# Patient Record
Sex: Female | Born: 1965 | Race: White | Hispanic: No
Health system: Southern US, Community
[De-identification: ages and names within clinical notes are randomized; demographics above are authoritative.]

---

## 2013-09-01 ENCOUNTER — Observation Stay: Payer: Self-pay | Admitting: Internal Medicine

## 2013-09-01 LAB — BASIC METABOLIC PANEL
Anion Gap: 4 — ABNORMAL LOW (ref 7–16)
BUN: 12 mg/dL (ref 7–18)
Chloride: 105 mmol/L (ref 98–107)
Creatinine: 0.64 mg/dL (ref 0.60–1.30)
Glucose: 85 mg/dL (ref 65–99)
Osmolality: 273 (ref 275–301)
Sodium: 137 mmol/L (ref 136–145)

## 2013-09-01 LAB — CBC
HCT: 39 % (ref 35.0–47.0)
MCH: 30.2 pg (ref 26.0–34.0)
MCV: 89 fL (ref 80–100)
Platelet: 203 10*3/uL (ref 150–440)
WBC: 7.8 10*3/uL (ref 3.6–11.0)

## 2013-09-01 LAB — TROPONIN I
Troponin-I: <0.02 ng/mL
Troponin-I: <0.02 ng/mL

## 2013-09-01 LAB — CK TOTAL AND CKMB (NOT AT ARMC): CK-MB: 2.3 ng/mL (ref 0.5–3.6)

## 2013-09-01 IMAGING — CR DG CHEST 2V
1 series · 2 of 2 positions shown · non-contrast
Comparison: None.

CLINICAL DATA: Chest pain.

EXAM:
CHEST  2 VIEW

[Series 1: w chest pa · 0.14mm/px · 2 of 2 slices shown]
[im 1/2]
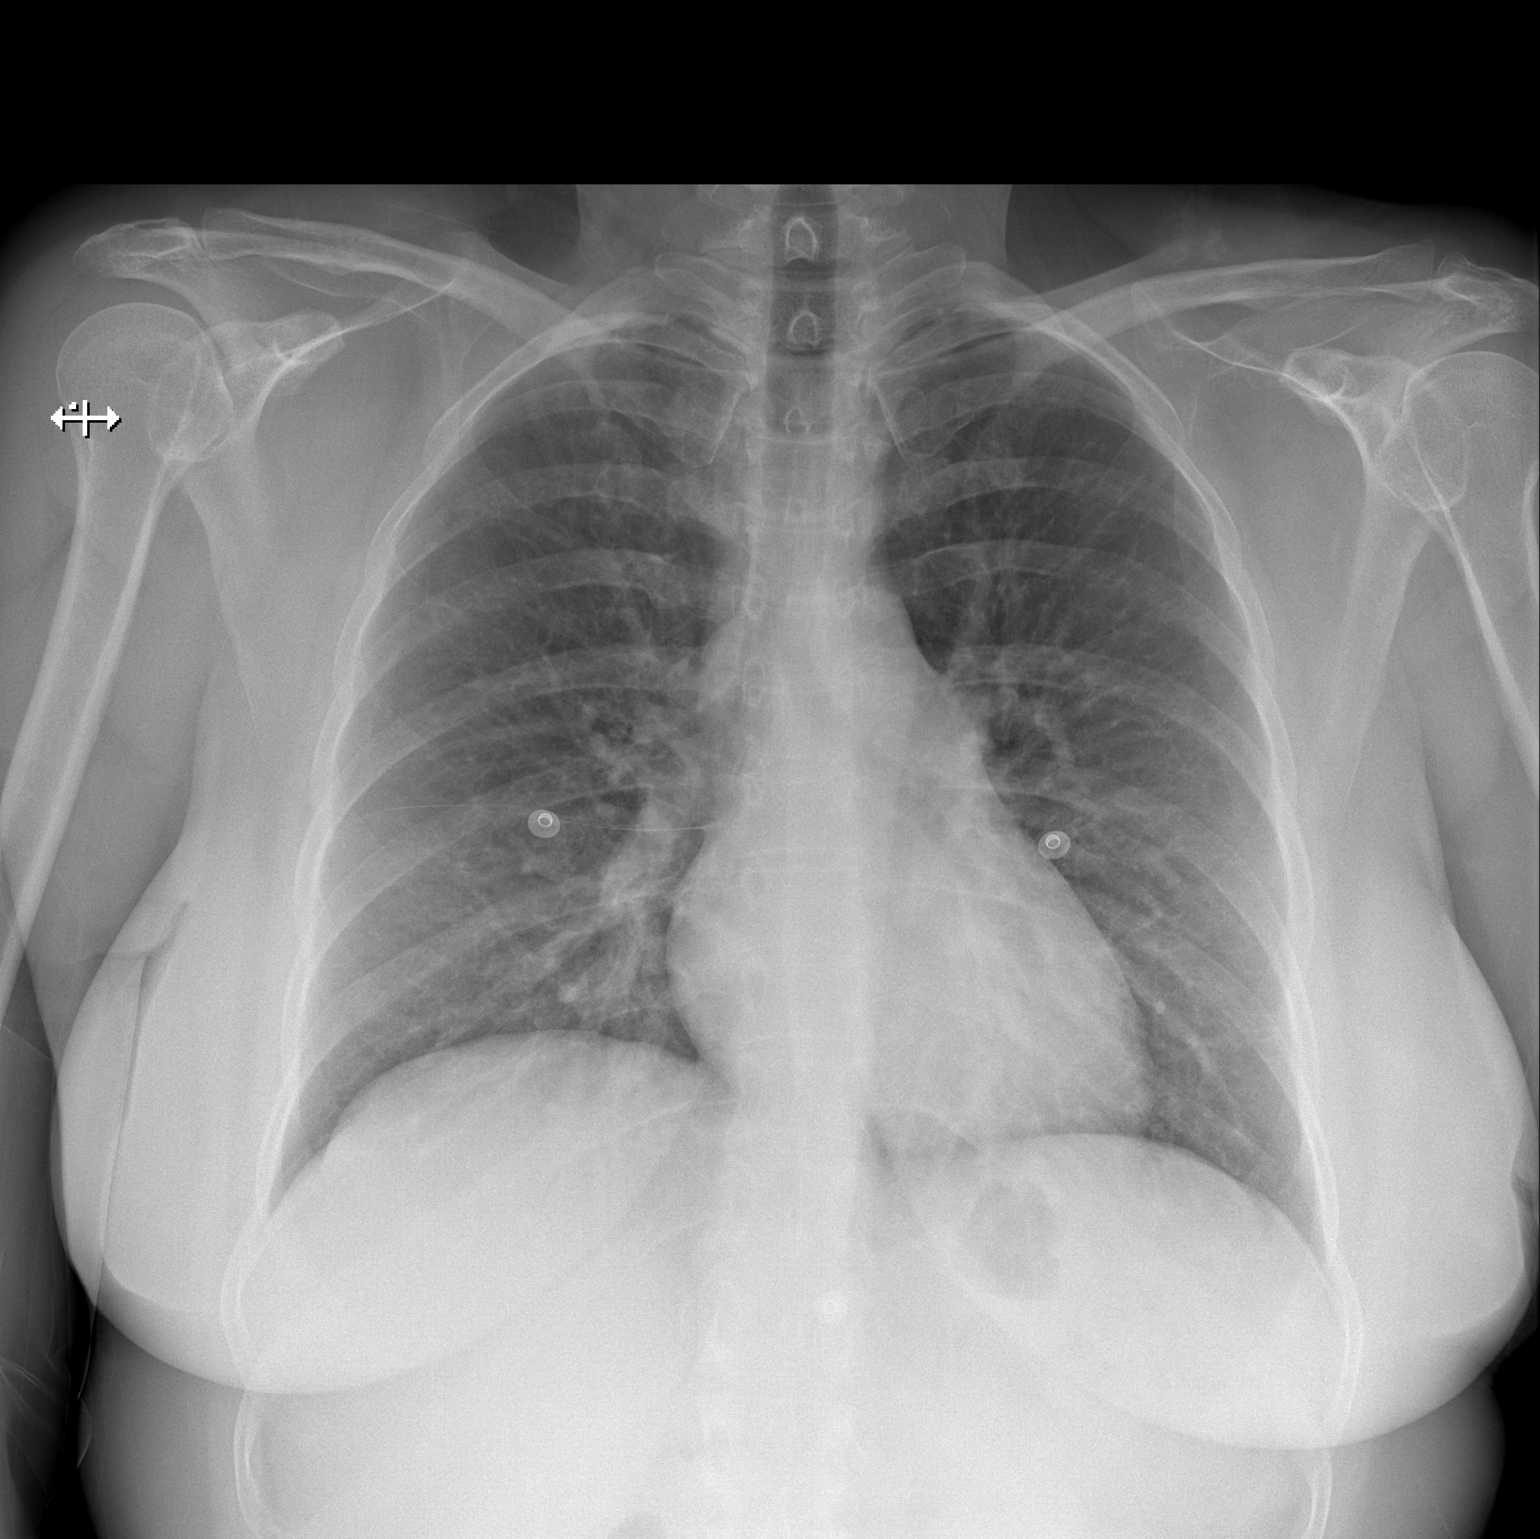
[im 2/2]
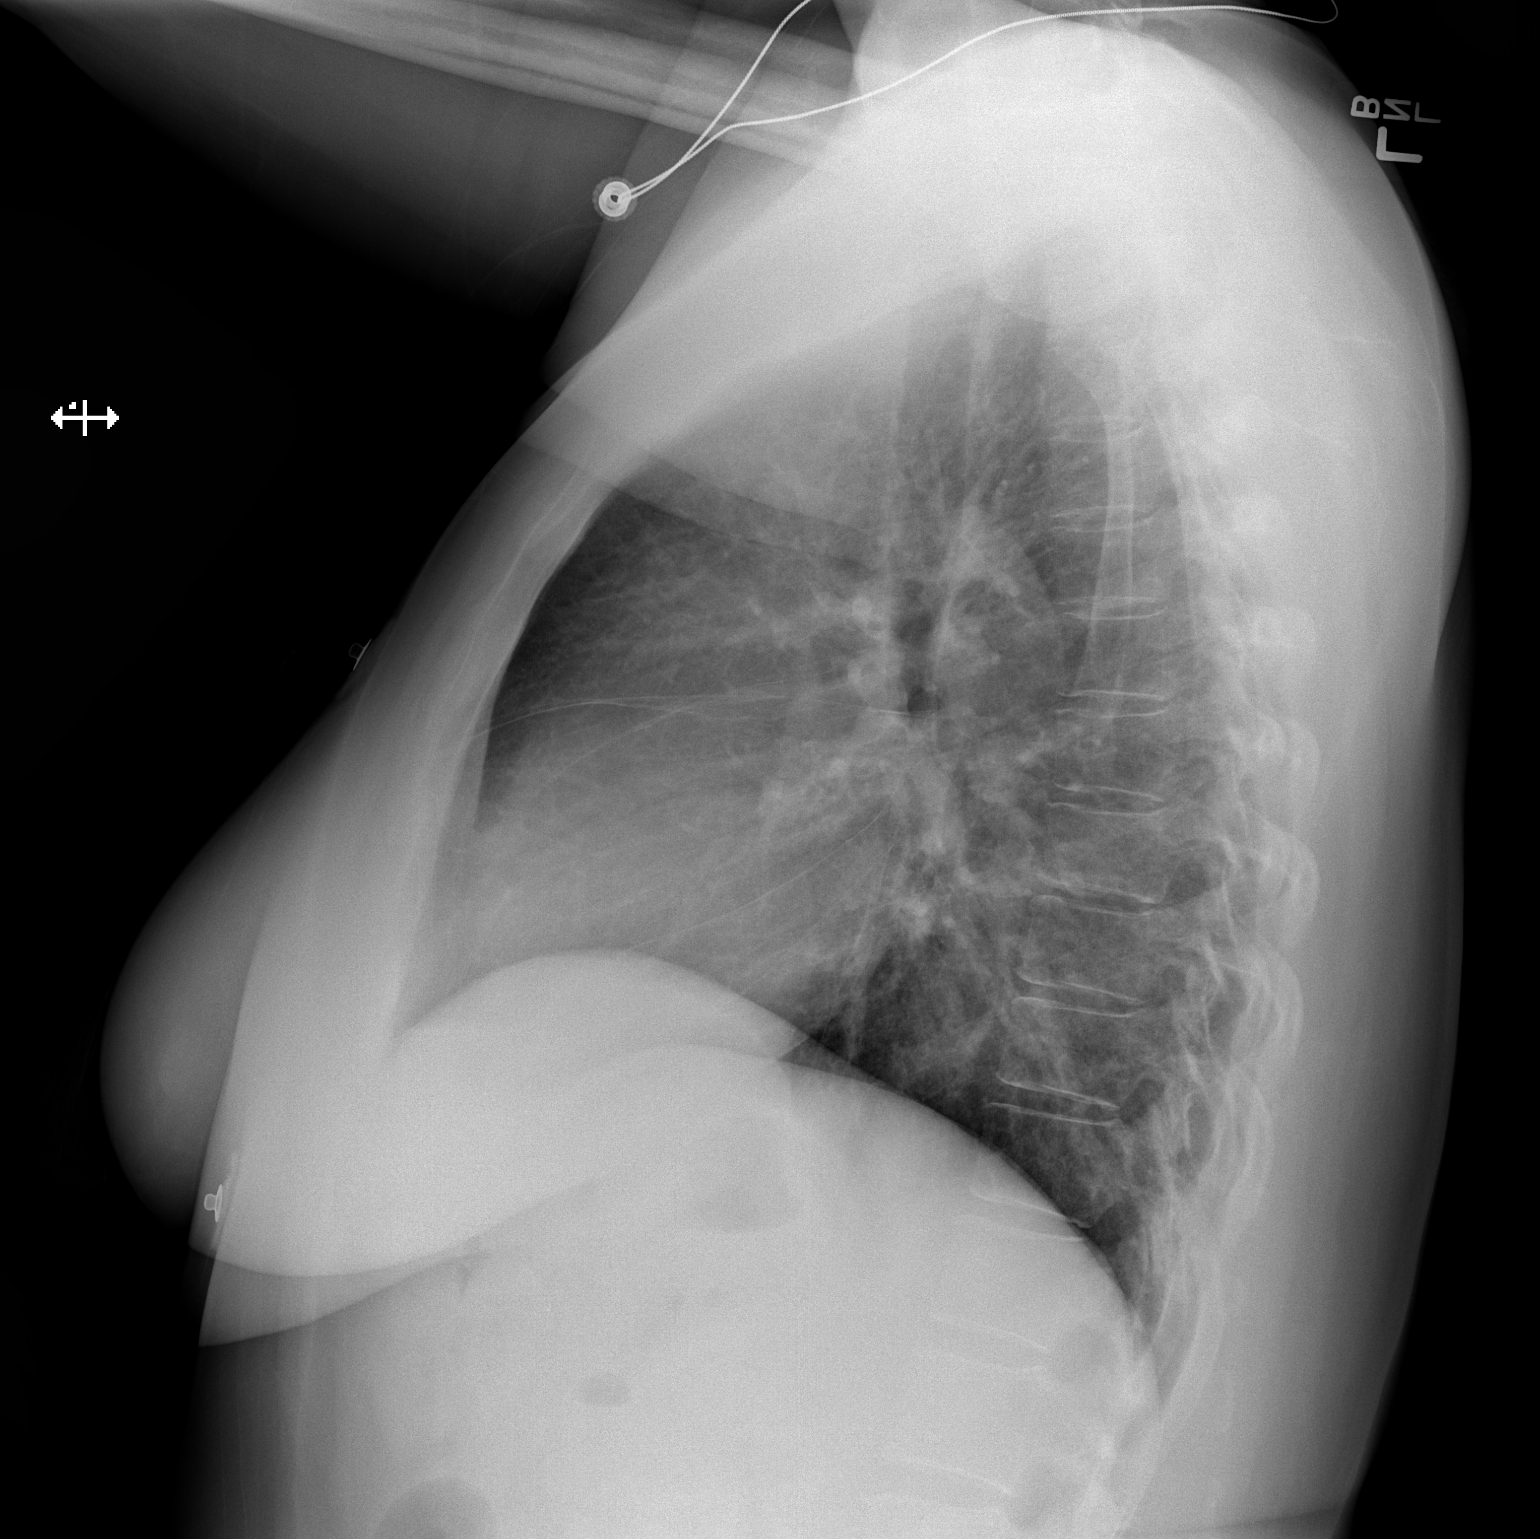

[2 of 2 positions shown; findings below may reference images not displayed]

FINDINGS: The heart size and mediastinal contours are within normal limits.
Both lungs are clear. The visualized skeletal structures are
unremarkable.
IMPRESSION: No active cardiopulmonary disease.

## 2014-12-24 NOTE — H&P (Signed)
PATIENT NAME:  Abigail Mcdowell, Abigail Mcdowell MR#:  [id number] DATE OF BIRTH:  30-Jun-1966  DATE OF ADMISSION:  09/01/2013  [place] PHYSIC[place].   REFERRING EMERGENCY ROOM PHYSICIAN: Dr. Glennie IsleSheryl Gottlieb.  CHIEF COMPLAINT: Chest pain.   HISTORY OF PRESENT ILLNESS: A 49 year old female who is a smoker and does not have any medical issues except depression, has family history positive for coronary artery disease in her mother in late 7340s and had triple bypass at that time. Is working in a store, was Museum/gallery curatorchecking inventory and counting, not any physical job today in the morning, and started suddenly having chest pain which was retrosternal and on the left side, 10 out of 10, pressure-like as if somebody sitting on the chest and she could not breathe with that. The pain was radiating to her neck and so finally called EMS. EMS gave her 2 tablets of aspirin and 2 sprays of nitroglycerin under her tongue and that helped to resolve the pain.  Overall, pain lasted for 45 to 50 minutes. She, on further questioning, says that she had similar but less severe pain almost a month ago but it resolved by itself in a few minutes. On further questioning, also says that there is no associated palpitation, cough or wheezing, nor any dizziness or loss of consciousness episode. Because of her strong history, she is being admitted for further evaluation of coronary artery disease.   REVIEW OF SYSTEMS: CONSTITUTIONAL: Negative for fever, fatigue, weakness, pain or weight loss.  EYES: No blurring, double vision, discharge or redness.  EARS, NOSE, THROAT: No tinnitus, ear pain or hearing loss.  RESPIRATORY: No cough, wheezing, hemoptysis or shortness of breath.  CARDIOVASCULAR: Had some chest pain but no orthopnea, edema, arrhythmia or palpitations.  GASTROINTESTINAL: No nausea, vomiting, diarrhea or abdominal pain.  GENITOURINARY: No dysuria, hematuria or increased frequency.  ENDOCRINE: No increased sweating. No heat or cold  intolerance.  SKIN: No acne, rashes or lesions on the skin.  MUSCULOSKELETAL: No pain or swelling in the joints.  NEUROLOGICAL: No numbness, weakness, tremor or vertigo.  PSYCHIATRIC: No anxiety, insomnia, bipolar disorder.   PAST MEDICAL HISTORY: Depression.  Goes to a doctor regularly for checkup and denies any other medical history.   PAST SURGICAL HISTORY: Partial hysterectomy years ago.   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 She is a smoker, 1/2 pack of cigarettes since last 30 years every day. Denies alcohol or illegal drug use.   FAMILY HISTORY: Positive for coronary artery disease in mother at age around 2749, ended up having triple bypass surgery later on. Multiple family members on the maternal side and her grandfather on paternal side had coronary artery disease.   HOME MEDICATIONS: Xanax as needed and Paxil every day for depression.   PHYSICAL EXAMINATION: VITAL SIGNS: In ER, temperature 98, pulse 57, respirations 16, blood pressure 109/61 and pulse ox 98% on room air.  GENERAL: The patient is fully alert and oriented to time, place and person. Does not appear in any acute distress.  HEENT: Head and neck atraumatic. Conjunctivae pink. Oral mucosa moist.  NECK: Supple. No JVD.  RESPIRATORY: Bilateral clear and equal air entry.  CARDIOVASCULAR: S1, S2 present, regular. No murmur.  ABDOMEN: Soft, nontender. Bowel sounds present. No organomegaly.  SKIN: No rashes.  LEGS: No edema.  NEUROLOGICAL: Power 5 out of 5. Follows commands. No gross abnormality.  SKIN: No rashes.  JOINTS: No swelling or tenderness.  PSYCHIATRIC: Does not appear in any acute  psychiatric illness at this time.   LABORATORY, DIAGNOSTIC AND RADIOLOGICAL RESULTS: Glucose 85, creatinine 0.64, sodium 137, potassium 3.6, chloride 105, CO2 28, calcium 8.9. Troponin less than 0.02. WBC 7.8, hemoglobin 13.2, platelet count 203, MCV 89. Chest x-ray, PA and lateral, does not show any acute  cardiopulmonary disease. EKG revealed rate 58 ventricular rate, normal sinus rhythm with slight bradycardia.   ASSESSMENT AND PLAN: A 49 year old female with a past medical history of smoking and strong family history of coronary artery disease. Had chest pain which resolved by aspirin and nitroglycerin.  1.  Chest pain.  We will admit her to telemetry and follow serial troponins. Will do stress test to rule out any coronary artery disease.  2.  Smoking. Advised to stop smoking and asked for nicotine patch. Counseling done for 4 minutes. She does not want nicotine patch at this time but she will consider about quitting the smoking gradually.   TOTAL TIME SPENT ON THIS ADMISSION: 45 minutes.   CODE STATUS:  Full code.    ____________________________ Hope Pigeon Elisabeth Pigeon, MD vgv:cs D: 09/01/2013 14:20:00 ET T: 09/01/2013 14:46:17 ET JOB#: [id number]  cc: Hope Pigeon. Elisabeth Pigeon, MD, <Dictator> Altamese Dilling MD ELECTRONICALLY SIGNED 09/05/2013 22:07

## 2014-12-24 NOTE — Discharge Summary (Signed)
PATIENT NAME:  Abigail AquasBOWDEN, Lauriana MR#:  [id number] DATE OF BIRTH:  04/27/1966  DATE OF ADMISSION:  09/01/2013 DATE OF DISCHARGE:  09/02/2013  DISCHARGE DIAGNOSES:  1. Chest pain due to gastroesophageal reflux disease.  2. Anxiety and depression.   DISCHARGE MEDICATIONS:  1. Paxil 40 mg p.o. daily. 2. Xanax 0.25 mg p.o. t.i.d.  3. Omeprazole 20 mg p.o. daily.   CONSULTATIONS: None.   HOSPITAL COURSE: The patient is a 49 year old female patient admitted for chest pain. Look at the history and physical for full details. [place]. The patient had chest pain associated with nausea, and chest pain relieved with aspirin and nitroglycerin given in the ER. The patient's vitals were stable except slight bradycardia on presentation with heart rate 57 beats. EKG on admission showed normal sinus rhythm with slight bradycardia. The patient's troponins have been negative x3. The patient had a stress test this morning which showed a normal EF at 80% with normal coronaries and no acute ST-T changes. The patient told me that after Christmas, she felt like more belching and burning in stomach. She thought she had reflux,  but did not take any medication. She also feels nauseous after eating. So, I told her to continue Protonix or add Prilosec over-the-counter tablets. If the nausea still persists, she can have followup with her primary doctor to evaluate for any gallbladder problem.   CONDITION AT THIS TIME: Stable.   DISPOSITION: The patient discharged home in stable condition.   TIME SPENT: More than 30 minutes.   ____________________________ Katha HammingSnehalatha Eular Panek, MD sk:lb D: 09/02/2013 13:41:58 ET T: 09/02/2013 13:53:56 ET JOB#: [id number]  cc: Katha HammingSnehalatha Romona Murdy, MD, <Dictator> Primary Doctor, [place] [facility] MD ELECTRONICALLY SIGNED 09/17/2013 15:00
